# Patient Record
Sex: Female | Born: 1990 | Race: White | Hispanic: No | Smoking: Never smoker
Health system: Southern US, Community
[De-identification: ages and names within clinical notes are randomized; demographics above are authoritative.]

## PROBLEM LIST (undated history)

## (undated) DIAGNOSIS — B019 Varicella without complication: Secondary | ICD-10-CM

## (undated) DIAGNOSIS — J329 Chronic sinusitis, unspecified: Principal | ICD-10-CM

## (undated) DIAGNOSIS — F32A Depression, unspecified: Secondary | ICD-10-CM

## (undated) DIAGNOSIS — R635 Abnormal weight gain: Secondary | ICD-10-CM

## (undated) DIAGNOSIS — K589 Irritable bowel syndrome without diarrhea: Secondary | ICD-10-CM

## (undated) DIAGNOSIS — H00019 Hordeolum externum unspecified eye, unspecified eyelid: Secondary | ICD-10-CM

## (undated) DIAGNOSIS — T7840XA Allergy, unspecified, initial encounter: Secondary | ICD-10-CM

## (undated) DIAGNOSIS — F329 Major depressive disorder, single episode, unspecified: Secondary | ICD-10-CM

## (undated) DIAGNOSIS — Z0000 Encounter for general adult medical examination without abnormal findings: Secondary | ICD-10-CM

## (undated) DIAGNOSIS — F419 Anxiety disorder, unspecified: Secondary | ICD-10-CM

## (undated) DIAGNOSIS — J45909 Unspecified asthma, uncomplicated: Secondary | ICD-10-CM

## (undated) DIAGNOSIS — E282 Polycystic ovarian syndrome: Secondary | ICD-10-CM

## (undated) DIAGNOSIS — H548 Legal blindness, as defined in USA: Secondary | ICD-10-CM

## (undated) DIAGNOSIS — F988 Other specified behavioral and emotional disorders with onset usually occurring in childhood and adolescence: Secondary | ICD-10-CM

## (undated) HISTORY — DX: Major depressive disorder, single episode, unspecified: F32.9

## (undated) HISTORY — DX: Anxiety disorder, unspecified: F41.9

## (undated) HISTORY — PX: WISDOM TOOTH EXTRACTION: SHX21

## (undated) HISTORY — DX: Polycystic ovarian syndrome: E28.2

## (undated) HISTORY — DX: Allergy, unspecified, initial encounter: T78.40XA

## (undated) HISTORY — DX: Depression, unspecified: F32.A

## (undated) HISTORY — DX: Irritable bowel syndrome without diarrhea: K58.9

## (undated) HISTORY — DX: Hordeolum externum unspecified eye, unspecified eyelid: H00.019

## (undated) HISTORY — DX: Other specified behavioral and emotional disorders with onset usually occurring in childhood and adolescence: F98.8

## (undated) HISTORY — DX: Encounter for general adult medical examination without abnormal findings: Z00.00

## (undated) HISTORY — DX: Abnormal weight gain: R63.5

## (undated) HISTORY — DX: Varicella without complication: B01.9

## (undated) HISTORY — PX: TONSILLECTOMY: SHX5217

## (undated) HISTORY — DX: Unspecified asthma, uncomplicated: J45.909

## (undated) HISTORY — DX: Chronic sinusitis, unspecified: J32.9

## (undated) HISTORY — DX: Legal blindness, as defined in USA: H54.8

---
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
## 2011-12-17 ENCOUNTER — Encounter: Payer: Self-pay | Admitting: Family Medicine

## 2011-12-17 ENCOUNTER — Ambulatory Visit (INDEPENDENT_AMBULATORY_CARE_PROVIDER_SITE_OTHER): Payer: Managed Care, Other (non HMO) | Admitting: Family Medicine

## 2011-12-17 VITALS — BP 105/66 | HR 74 | Temp 97.5°F | Ht 64.75 in | Wt 130.1 lb

## 2011-12-17 DIAGNOSIS — J329 Chronic sinusitis, unspecified: Secondary | ICD-10-CM

## 2011-12-17 DIAGNOSIS — K589 Irritable bowel syndrome without diarrhea: Secondary | ICD-10-CM

## 2011-12-17 DIAGNOSIS — E282 Polycystic ovarian syndrome: Secondary | ICD-10-CM

## 2011-12-17 DIAGNOSIS — F988 Other specified behavioral and emotional disorders with onset usually occurring in childhood and adolescence: Secondary | ICD-10-CM

## 2011-12-17 DIAGNOSIS — H548 Legal blindness, as defined in USA: Secondary | ICD-10-CM

## 2011-12-17 DIAGNOSIS — F419 Anxiety disorder, unspecified: Secondary | ICD-10-CM

## 2011-12-17 DIAGNOSIS — Z0000 Encounter for general adult medical examination without abnormal findings: Secondary | ICD-10-CM

## 2011-12-17 DIAGNOSIS — F329 Major depressive disorder, single episode, unspecified: Secondary | ICD-10-CM | POA: Insufficient documentation

## 2011-12-17 DIAGNOSIS — F32A Depression, unspecified: Secondary | ICD-10-CM | POA: Insufficient documentation

## 2011-12-17 DIAGNOSIS — F341 Dysthymic disorder: Secondary | ICD-10-CM

## 2011-12-17 HISTORY — DX: Polycystic ovarian syndrome: E28.2

## 2011-12-17 HISTORY — DX: Chronic sinusitis, unspecified: J32.9

## 2011-12-17 HISTORY — DX: Encounter for general adult medical examination without abnormal findings: Z00.00

## 2011-12-17 MED ORDER — AZITHROMYCIN 250 MG PO TABS: ORAL_TABLET | ORAL | Status: AC

## 2011-12-17 MED ORDER — GUAIFENESIN ER 600 MG PO TB12: 600.0000 mg | ORAL_TABLET | Freq: Two times a day (BID) | ORAL | Status: DC

## 2011-12-17 NOTE — Progress Notes (Signed)
Patient ID: Susan Schroeder, female   DOB: 11-Dec-1990, 21 y.o.   MRN: [id number] Susan Schroeder [id number] 1990-09-07 12/17/2011      Progress Note-Follow Up  Subjective  Chief Complaint  Chief Complaint  Patient presents with  . Establish Care    new patient    HPI  Patient is a 21 year old Caucasian female who is here today to establish care. She is here today mostly because she has a sinus infection and has been told by work she needs a work note either keeping her out of work or allow her to return to work in her current state of health. She's been struggling with head congestion for about 4-5 days now. She is headache and congestion some clear rhinorrhea. She's had some nosebleeds and a cough which is dry most of time. She has some mild fatigue but no myalgias no fevers or chills. Otherwise she struggles with irritable bowel syndrome for which she uses Bentyl intermittently but has not had trouble recently. She's in school and working and lives with her parents at this time. 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 Follows with Dr. Clarisa Kindred. She denies any significant other recent illness. No chest pain, palpitations, shortness of breath, wheezing, GI or GU complaints although she does note roughly one episode of loose stool daily for the last couple of days  Past Medical History  Diagnosis Date  . Chicken pox as a child  . Anxiety   . Depression   . Diabetes mellitus     pre diabetes- when younger  . ADD (attention deficit disorder) 21 yrs old  . Asthma     as young child  . Sinusitis 12/17/2011  . Preventative health care 12/17/2011  . PCOS (polycystic ovarian syndrome) 12/17/2011  . Anxiety and depression     Past Surgical History  Procedure Date  . Wisdom tooth extraction 21 yrs old  . Tonsillectomy 21 yrs old    Family History  Problem Relation Age  of Onset  . Diabetes Paternal Grandmother   . Hypertension Paternal Grandmother   . Hyperlipidemia Paternal Grandmother     History   Social History  . Marital Status: Unknown    Spouse Name: N/A    Number of Children: N/A  . Years of Education: N/A   Occupational History  . Not on file.   Social History Main Topics  . Smoking status: Never Smoker   . Smokeless tobacco: Never Used  . Alcohol Use: No  . Drug Use: No  . Sexually Active: Yes -- Female partner(s)   Other Topics Concern  . Not on file   Social History Narrative  . No narrative on file    Current Outpatient Prescriptions on File Prior to Visit  Medication Sig Dispense Refill  . dicyclomine (BENTYL) 20 MG tablet Take 20 mg by mouth as needed.      . drospirenone-ethinyl estradiol (YAZ,GIANVI,LORYNA) 3-0.02 MG tablet Take 1 tablet by mouth daily.      Marland Kitchen escitalopram (LEXAPRO) 10 MG tablet Take 10 mg by mouth daily.      . methylphenidate (CONCERTA) 36 MG CR tablet Take 36 mg by mouth every morning.        No Known Allergies  Review of Systems  Review of Systems  Constitutional: Negative for fever and malaise/fatigue.  HENT: Positive for congestion and sore throat.  Eyes: Negative for discharge.  Respiratory: Positive for cough and sputum production. Negative for shortness of breath.   Cardiovascular: Negative for chest pain, palpitations and leg swelling.  Gastrointestinal: Negative for nausea, abdominal pain and diarrhea.  Genitourinary: Negative for dysuria.  Musculoskeletal: Negative for falls.  Skin: Negative for rash.  Neurological: Negative for loss of consciousness and headaches.  Endo/Heme/Allergies: Negative for polydipsia.  Psychiatric/Behavioral: Negative for depression and suicidal ideas. The patient is not nervous/anxious and does not have insomnia.     Objective  BP 105/66  Pulse 74  Temp 97.5 F (36.4 C) (Temporal)  Ht 5' 4.75" (1.645 m)  Wt 130 lb 1.9 oz (59.022 kg)  BMI 21.82  kg/m2  SpO2 98%  LMP 11/16/2011  Physical Exam  Physical Exam  Constitutional: She is oriented to person, place, and time and well-developed, well-nourished, and in no distress. No distress.  HENT:  Head: Normocephalic and atraumatic.       Oropharynx erythematous.  Eyes: Conjunctivae are normal.  Neck: Neck supple. No thyromegaly present.  Cardiovascular: Normal rate, regular rhythm and normal heart sounds.   No murmur heard. Pulmonary/Chest: Effort normal and breath sounds normal. She has no wheezes.  Abdominal: She exhibits no distension and no mass.  Musculoskeletal: She exhibits no edema.  Lymphadenopathy:    She has no cervical adenopathy.  Neurological: She is alert and oriented to person, place, and time.  Skin: Skin is warm and dry. No rash noted. She is not diaphoretic.  Psychiatric: Memory, affect and judgment normal.      Assessment & Plan  Sinusitis Zpak, mucinex, increase rest and fluids.  PCOS (polycystic ovarian syndrome) Follows with endocrinology and is on OCPs.  ADD (attention deficit disorder) Is on Concerta and she finds it helpful while in school  Preventative health care She agrees to get [place] copies of her labs she has done with her endocrinologist. Request old records. Return in 6 months or as needed  Anxiety and depression Follows with psychiatry and they prescribe her anti depressants. Doing well on her current meds.  IBS (irritable bowel syndrome) Stable at the moment, uses Bentyl infrequently with good results, encouraged daily fiber and probiotics.

## 2011-12-17 NOTE — Assessment & Plan Note (Signed)
Follows with endocrinology and is on OCPs.

## 2011-12-17 NOTE — Assessment & Plan Note (Signed)
Is on Concerta and she finds it helpful while in school

## 2011-12-17 NOTE — Patient Instructions (Addendum)

## 2011-12-17 NOTE — Assessment & Plan Note (Signed)
She agrees to get [place] copies of her labs she has done with her endocrinologist. Request old records. Return in 6 months or as needed

## 2011-12-17 NOTE — Assessment & Plan Note (Signed)
Follows with psychiatry and they prescribe her anti depressants. Doing well on her current meds.

## 2011-12-17 NOTE — Assessment & Plan Note (Signed)
Zpak, mucinex, increase rest and fluids.

## 2011-12-18 ENCOUNTER — Encounter: Payer: Self-pay | Admitting: Family Medicine

## 2011-12-18 DIAGNOSIS — H548 Legal blindness, as defined in USA: Secondary | ICD-10-CM

## 2011-12-18 DIAGNOSIS — K589 Irritable bowel syndrome without diarrhea: Secondary | ICD-10-CM

## 2011-12-18 HISTORY — DX: Legal blindness, as defined in USA: H54.8

## 2011-12-18 HISTORY — DX: Irritable bowel syndrome, unspecified: K58.9

## 2011-12-18 NOTE — Assessment & Plan Note (Signed)
Stable at the moment, uses Bentyl infrequently with good results, encouraged daily fiber and probiotics.

## 2012-06-10 ENCOUNTER — Ambulatory Visit: Payer: Managed Care, Other (non HMO) | Admitting: Family Medicine

## 2012-10-18 ENCOUNTER — Ambulatory Visit: Payer: Managed Care, Other (non HMO) | Admitting: Family

## 2012-10-22 ENCOUNTER — Ambulatory Visit (INDEPENDENT_AMBULATORY_CARE_PROVIDER_SITE_OTHER): Payer: Managed Care, Other (non HMO) | Admitting: Family Medicine

## 2012-10-22 ENCOUNTER — Encounter: Payer: Self-pay | Admitting: Family Medicine

## 2012-10-22 VITALS — BP 118/64 | HR 66 | Temp 98.2°F | Ht 64.75 in | Wt 146.0 lb

## 2012-10-22 DIAGNOSIS — F419 Anxiety disorder, unspecified: Secondary | ICD-10-CM

## 2012-10-22 DIAGNOSIS — R635 Abnormal weight gain: Secondary | ICD-10-CM

## 2012-10-22 DIAGNOSIS — F341 Dysthymic disorder: Secondary | ICD-10-CM

## 2012-10-22 DIAGNOSIS — J45909 Unspecified asthma, uncomplicated: Secondary | ICD-10-CM

## 2012-10-22 DIAGNOSIS — H44001 Unspecified purulent endophthalmitis, right eye: Secondary | ICD-10-CM

## 2012-10-22 DIAGNOSIS — H44009 Unspecified purulent endophthalmitis, unspecified eye: Secondary | ICD-10-CM

## 2012-10-22 DIAGNOSIS — H00019 Hordeolum externum unspecified eye, unspecified eyelid: Secondary | ICD-10-CM

## 2012-10-22 DIAGNOSIS — T7840XA Allergy, unspecified, initial encounter: Secondary | ICD-10-CM

## 2012-10-22 DIAGNOSIS — H00013 Hordeolum externum right eye, unspecified eyelid: Secondary | ICD-10-CM

## 2012-10-22 LAB — RENAL FUNCTION PANEL
Albumin: 4.2 g/dL (ref 3.5–5.2)
BUN: 14 mg/dL (ref 6–23)
CO2: 27 mEq/L (ref 19–32)
Calcium: 9.8 mg/dL (ref 8.4–10.5)
Chloride: 105 mEq/L (ref 96–112)
Creat: 0.8 mg/dL (ref 0.50–1.10)
Glucose, Bld: 84 mg/dL (ref 70–99)
Phosphorus: 4.3 mg/dL (ref 2.3–4.6)
Potassium: 4.5 mEq/L (ref 3.5–5.3)
Sodium: 139 mEq/L (ref 135–145)

## 2012-10-22 LAB — CBC
MCHC: 33.7 g/dL (ref 30.0–36.0)
RBC: 4.78 MIL/uL (ref 3.87–5.11)
WBC: 6 10*3/uL (ref 4.0–10.5)

## 2012-10-22 MED ORDER — LORATADINE 10 MG PO TABS: 10.0000 mg | ORAL_TABLET | Freq: Every day | ORAL | Status: AC

## 2012-10-22 MED ORDER — ALBUTEROL SULFATE HFA 108 (90 BASE) MCG/ACT IN AERS: 2.0000 | INHALATION_SPRAY | Freq: Four times a day (QID) | RESPIRATORY_TRACT | Status: AC | PRN

## 2012-10-22 NOTE — Patient Instructions (Addendum)
Probiotics such as Digestive Advantage daily Witch Hazel Astringent  Annual exam 6- 12 months or as needed   DASH Diet The DASH diet stands for "Dietary Approaches to Stop Hypertension." It is a healthy eating plan that has been shown to reduce high blood pressure (hypertension) in as little as 14 days, while also possibly providing other significant health benefits. These other health benefits include reducing the risk of breast cancer after menopause and reducing the risk of type 2 diabetes, heart disease, colon cancer, and stroke. Health benefits also include weight loss and slowing kidney failure in patients with chronic kidney disease.  DIET GUIDELINES  Limit salt (sodium). Your diet should contain less than 1500 mg of sodium daily.  Limit refined or processed carbohydrates. Your diet should include mostly whole grains. Desserts and added sugars should be used sparingly.  Include small amounts of heart-healthy fats. These types of fats include nuts, oils, and tub margarine. Limit saturated and trans fats. These fats have been shown to be harmful in the body. CHOOSING FOODS  The following food groups are based on a 2000 calorie diet. See your Registered Dietitian for individual calorie needs. Grains and Grain Products (6 to 8 servings daily)  Eat More Often: Whole-wheat bread, brown rice, whole-grain or wheat pasta, quinoa, popcorn without added fat or salt (air popped).  Eat Less Often: White bread, white pasta, white rice, cornbread. Vegetables (4 to 5 servings daily)  Eat More Often: Fresh, frozen, and canned vegetables. Vegetables may be raw, steamed, roasted, or grilled with a minimal amount of fat.  Eat Less Often/Avoid: Creamed or fried vegetables. Vegetables in a cheese sauce. Fruit (4 to 5 servings daily)  Eat More Often: All fresh, canned (in natural juice), or frozen fruits. Dried fruits without added sugar. One hundred percent fruit juice ( cup [237 mL] daily).  Eat  Less Often: Dried fruits with added sugar. Canned fruit in light or heavy syrup. [employer], Fish, and Poultry (2 servings or less daily. One serving is 3 to 4 oz [85-114 g]).  Eat More Often: Ninety percent or leaner ground beef, tenderloin, sirloin. Round cuts of beef, chicken breast, [place] breast. All fish. Grill, bake, or broil your meat. Nothing should be fried.  Eat Less Often/Avoid: Fatty cuts of meat, [place], or chicken leg, thigh, or wing. Fried cuts of meat or fish. Dairy (2 to 3 servings)  Eat More Often: Low-fat or fat-free milk, low-fat plain or light yogurt, reduced-fat or part-skim cheese.  Eat Less Often/Avoid: Milk (whole, 2%).Whole milk yogurt. Full-fat cheeses. Nuts, Seeds, and Legumes (4 to 5 servings per week)  Eat More Often: All without added salt.  Eat Less Often/Avoid: Salted nuts and seeds, canned beans with added salt. Fats and Sweets (limited)  Eat More Often: Vegetable oils, tub margarines without trans fats, sugar-free gelatin. Mayonnaise and salad dressings.  Eat Less Often/Avoid: Coconut oils, palm oils, butter, stick margarine, cream, half and half, cookies, candy, pie. FOR MORE INFORMATION The Dash Diet Eating Plan: www.dashdiet.org Document Released: 05/29/2011 Document Revised: 09/01/2011 Document Reviewed: 05/29/2011 [facility] Patient Information 2013 [place], [place].

## 2012-10-24 ENCOUNTER — Encounter: Payer: Self-pay | Admitting: Family Medicine

## 2012-10-24 DIAGNOSIS — T7840XA Allergy, unspecified, initial encounter: Secondary | ICD-10-CM

## 2012-10-24 DIAGNOSIS — R635 Abnormal weight gain: Secondary | ICD-10-CM | POA: Insufficient documentation

## 2012-10-24 DIAGNOSIS — H00019 Hordeolum externum unspecified eye, unspecified eyelid: Secondary | ICD-10-CM | POA: Insufficient documentation

## 2012-10-24 HISTORY — DX: Abnormal weight gain: R63.5

## 2012-10-24 HISTORY — DX: Hordeolum externum unspecified eye, unspecified eyelid: H00.019

## 2012-10-24 HISTORY — DX: Allergy, unspecified, initial encounter: T78.40XA

## 2012-10-24 NOTE — Assessment & Plan Note (Addendum)
Doing well at present time 

## 2012-10-24 NOTE — Assessment & Plan Note (Signed)
Resolved before visit with use of homeopathic eye drops, encouraged probiotics and report return of symptoms warm compresses prn

## 2012-10-24 NOTE — Assessment & Plan Note (Signed)
Claritin daily, nasal saline prn and given Albuterol for a h/o allergy induced asthma and some recent tightness

## 2012-10-24 NOTE — Assessment & Plan Note (Signed)
Encouraged DASH diet and increased exercise, referred to nutritionist and labs reviewed today

## 2012-10-24 NOTE — Progress Notes (Signed)
Patient ID: Susan Schroeder, female   DOB: 1990/08/31, 22 y.o.   MRN: [id number] Susan Schroeder [id number] 07/01/1990 10/24/2012      Progress Note-Follow Up  Subjective  Chief Complaint  Chief Complaint  Patient presents with  . Stye    gone now- pt would like md to still look at it- right eye    HPI  Patient is a 22 year old Caucasian female who is in today with numerous complaints. One is she had about a week's worth of this I actually 2 small ones in the right. Married results at today's visit after she started some homeopathic drops. They were not painful but they were irritated. No fevers or chills no significant discharge or pain. She has had some recent flare in allergies. Some nasal congestion and even some tightness in her chest suggestive of some asthma which she has had in the past. She's frustrated with recently and is requesting a referral to nutritionist with no marked health. No other acute complaints. She does exercise regularly and tries to maintain a heart healthy diet. No chest pain, palpitations, shortness of breath, GI or GU concerns noted.  Past Medical History  Diagnosis Date  . Chicken pox as a child  . Anxiety   . Depression   . Diabetes mellitus     pre diabetes- when younger  . ADD (attention deficit disorder) 22 yrs old  . Asthma     as young child  . Sinusitis 12/17/2011  . Preventative health care 12/17/2011  . PCOS (polycystic ovarian syndrome) 12/17/2011  . Anxiety and depression   . IBS (irritable bowel syndrome) 12/18/2011  . 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 Weight gain 10/24/2012  . Stye 10/24/2012    Right eye  . Allergic state 10/24/2012    Past Surgical History  Procedure Laterality Date  . Wisdom tooth extraction  22 yrs old  . Tonsillectomy  22 yrs old    Family History  Problem Relation Age of Onset  . Diabetes  Paternal Grandmother   . Hypertension Paternal Grandmother   . Hyperlipidemia Paternal Grandmother     History   Social History  . Marital Status: Unknown    Spouse Name: N/A    Number of Children: N/A  . Years of Education: N/A   Occupational History  . Not on file.   Social History Main Topics  . Smoking status: Never Smoker   . Smokeless tobacco: Never Used  . Alcohol Use: No  . Drug Use: No  . Sexually Active: Yes -- Female partner(s)   Other Topics Concern  . Not on file   Social History Narrative  . No narrative on file    Current Outpatient Prescriptions on File Prior to Visit  Medication Sig Dispense Refill  . dicyclomine (BENTYL) 20 MG tablet Take 20 mg by mouth as needed.      . drospirenone-ethinyl estradiol (YAZ,GIANVI,LORYNA) 3-0.02 MG tablet Take 1 tablet by mouth daily.      Marland Kitchen escitalopram (LEXAPRO) 10 MG tablet Take 10 mg by mouth daily.       No current facility-administered medications on file prior to visit.    No Known Allergies  Review of Systems  Review of Systems  Constitutional: Negative for fever and malaise/fatigue.  HENT: Positive for congestion.   Eyes: Negative for discharge.  Respiratory: Negative for shortness of breath.  Cardiovascular: Negative for chest pain, palpitations and leg swelling.  Gastrointestinal: Negative for nausea, abdominal pain and diarrhea.  Genitourinary: Negative for dysuria.  Musculoskeletal: Negative for falls.  Skin: Negative for rash.  Neurological: Negative for loss of consciousness and headaches.  Endo/Heme/Allergies: Negative for polydipsia.  Psychiatric/Behavioral: Negative for depression and suicidal ideas. The patient is not nervous/anxious and does not have insomnia.     Objective  BP 118/64  Pulse 66  Temp(Src) 98.2 F (36.8 C) (Oral)  Ht 5' 4.75" (1.645 m)  Wt 146 lb 0.6 oz (66.243 kg)  BMI 24.48 kg/m2  SpO2 97%  LMP 10/21/2012  Physical Exam  Physical Exam  Constitutional: She is  oriented to person, place, and time and well-developed, well-nourished, and in no distress. No distress.  HENT:  Head: Normocephalic and atraumatic.  Eyes: Conjunctivae are normal.  Neck: Neck supple. No thyromegaly present.  Cardiovascular: Normal rate, regular rhythm and normal heart sounds.   No murmur heard. Pulmonary/Chest: Effort normal and breath sounds normal. She has no wheezes.  Abdominal: She exhibits no distension and no mass.  Musculoskeletal: She exhibits no edema.  Lymphadenopathy:    She has no cervical adenopathy.  Neurological: She is alert and oriented to person, place, and time.  Skin: Skin is warm and dry. No rash noted. She is not diaphoretic.  Psychiatric: Memory, affect and judgment normal.    Lab Results  Component Value Date   TSH 2.959 10/22/2012   Lab Results  Component Value Date   WBC 6.0 10/22/2012   HGB 14.1 10/22/2012   HCT 41.9 10/22/2012   MCV 87.7 10/22/2012   PLT 287 10/22/2012   Lab Results  Component Value Date   CREATININE 0.80 10/22/2012   BUN 14 10/22/2012   NA 139 10/22/2012   K 4.5 10/22/2012   CL 105 10/22/2012   CO2 27 10/22/2012     Assessment & Plan  Anxiety and depression Doing well at present time  Weight gain Encouraged DASH diet and increased exercise, referred to nutritionist and labs reviewed today  Stye Resolved before visit with use of homeopathic eye drops, encouraged probiotics and report return of symptoms warm compresses prn  Allergic state Claritin daily, nasal saline prn and given Albuterol for a h/o allergy induced asthma and some recent tightness

## 2012-10-25 NOTE — Progress Notes (Signed)
Quick Note:  Patient Informed and voiced understanding ______ 

## 2012-11-23 ENCOUNTER — Encounter: Payer: Self-pay | Admitting: Family

## 2012-11-23 ENCOUNTER — Ambulatory Visit (INDEPENDENT_AMBULATORY_CARE_PROVIDER_SITE_OTHER): Payer: Managed Care, Other (non HMO) | Admitting: Family

## 2012-11-23 ENCOUNTER — Ambulatory Visit (HOSPITAL_BASED_OUTPATIENT_CLINIC_OR_DEPARTMENT_OTHER)
Admission: RE | Admit: 2012-11-23 | Discharge: 2012-11-23 | Disposition: A | Discharge status: Home / Self Care | Payer: Managed Care, Other (non HMO) | Source: Ambulatory Visit | Attending: Family | Admitting: Family

## 2012-11-23 VITALS — BP 114/70 | HR 77 | Temp 98.1°F | Resp 16 | Wt 142.0 lb

## 2012-11-23 DIAGNOSIS — R197 Diarrhea, unspecified: Secondary | ICD-10-CM | POA: Insufficient documentation

## 2012-11-23 DIAGNOSIS — R112 Nausea with vomiting, unspecified: Secondary | ICD-10-CM | POA: Insufficient documentation

## 2012-11-23 DIAGNOSIS — Z0189 Encounter for other specified special examinations: Secondary | ICD-10-CM

## 2012-11-23 MED ORDER — ONDANSETRON HCL 4 MG PO TABS: 4.0000 mg | ORAL_TABLET | Freq: Three times a day (TID) | ORAL | Status: AC | PRN

## 2012-11-23 NOTE — Patient Instructions (Addendum)
Please complete abdominal x ray on the first floor. Start zofran.  If still unable to keep down small frequent sips or if abdominal pain occurs, please go to the ER.   Call if symptoms are not completely resolved in 1 week.

## 2012-11-23 NOTE — Progress Notes (Signed)
Subjective:    Patient ID: Susan Schroeder, female    DOB: Oct 27, 1990, 21 y.o.   MRN: [id number]  HPI   Susan Schroeder is a 22 yr old female who presents today with chief complaint of vomiting.  Symptoms started on 6/1.  Recently moved.  Reports that she is concerned about possible food poisoning.  She denies associated abdominal pain, anorexia or fever.  She does report associated episode of diarrhea yesterday AM. Reports that she is only keeping food down briefly.  Even water comes back up.       Review of Systems See HPI  Past Medical History  Diagnosis Date  . Chicken pox as a child  . Anxiety   . Depression   . Diabetes mellitus     pre diabetes- when younger  . ADD (attention deficit disorder) 22 yrs old  . Asthma     as young child  . Sinusitis 12/17/2011  . Preventative health care 12/17/2011  . PCOS (polycystic ovarian syndrome) 12/17/2011  . Anxiety and depression   . IBS (irritable bowel syndrome) 12/18/2011  . Blindness, legal 12/18/2011    [facility] follows with opthamology, Susan Schroeder Had a retinal tear in left eye earlier this year requiring 2 surgeries   . Weight gain 10/24/2012  . Stye 10/24/2012    Right eye  . Allergic state 10/24/2012    History   Social History  . Marital Status: Unknown    Spouse Name: N/A    Number of Children: N/A  . Years of Education: N/A   Occupational History  . Not on file.   Social History Main Topics  . Smoking status: Never Smoker   . Smokeless tobacco: Never Used  . Alcohol Use: No  . Drug Use: No  . Sexually Active: Yes -- Female partner(s)   Other Topics Concern  . Not on file   Social History Narrative  . No narrative on file    Past Surgical History  Procedure Laterality Date  . Wisdom tooth extraction  22 yrs old  . Tonsillectomy  22 yrs old    Family History  Problem Relation Age of Onset  . Diabetes Paternal Grandmother   . Hypertension Paternal Grandmother   . Hyperlipidemia Paternal Grandmother      No Known Allergies  Current Outpatient Prescriptions on File Prior to Visit  Medication Sig Dispense Refill  . albuterol (PROVENTIL HFA;VENTOLIN HFA) 108 (90 BASE) MCG/ACT inhaler Inhale 2 puffs into the lungs every 6 (six) hours as needed for wheezing.  1 Inhaler  2  . cholecalciferol (VITAMIN D) 1000 UNITS tablet Take 2,000 Units by mouth daily.      Marland Kitchen dicyclomine (BENTYL) 20 MG tablet Take 20 mg by mouth as needed.      . drospirenone-ethinyl estradiol (YAZ,GIANVI,LORYNA) 3-0.02 MG tablet Take 1 tablet by mouth daily.      Marland Kitchen escitalopram (LEXAPRO) 10 MG tablet Take 10 mg by mouth daily.      Marland Kitchen loratadine (CLARITIN) 10 MG tablet Take 1 tablet (10 mg total) by mouth daily.  30 tablet  11  . Multiple Vitamin (MULTIVITAMIN) tablet Take 1 tablet by mouth daily.       No current facility-administered medications on file prior to visit.    BP 114/70  Pulse 77  Temp(Src) 98.1 F (36.7 C) (Oral)  Resp 16  Wt 142 lb (64.411 kg)  BMI 23.8 kg/m2  SpO2 99%  LMP 11/09/2012       Objective:  Physical Exam  Constitutional: She is oriented to person, place, and time. She appears well-developed and well-nourished. No distress.  HENT:  Head: Normocephalic and atraumatic.  Cardiovascular: Normal rate and regular rhythm.   No murmur heard. Pulmonary/Chest: Effort normal and breath sounds normal. No respiratory distress. She has no wheezes. She has no rales. She exhibits no tenderness.  Abdominal: Soft. She exhibits no distension and no mass. There is no rebound and no guarding.  Mild periumbilical abdominal discomfort on exam ("pressure" per pt) No RLQ tenderness  Neurological: She is alert and oriented to person, place, and time.  Psychiatric: She has a normal mood and affect. Her behavior is normal. Judgment and thought content normal.          Assessment & Plan:

## 2012-11-23 NOTE — Assessment & Plan Note (Addendum)
Likely acute gastroenteritis, doubt food poisoning at this point as symptoms are ongoing.  KUB negative for SBO or free air.  Non-acute abdomen.  Rx with zofran prn.   Pt instructed to go to the ER if worsening abdominal pain, or if unable to keep down food/liquid despite anti-emetic.

## 2013-04-26 ENCOUNTER — Ambulatory Visit (INDEPENDENT_AMBULATORY_CARE_PROVIDER_SITE_OTHER): Payer: Managed Care, Other (non HMO) | Admitting: Family

## 2013-04-26 ENCOUNTER — Encounter: Payer: Self-pay | Admitting: Family

## 2013-04-26 VITALS — BP 110/80 | HR 71 | Temp 98.5°F | Resp 16 | Ht 64.75 in | Wt 153.1 lb

## 2013-04-26 DIAGNOSIS — J329 Chronic sinusitis, unspecified: Secondary | ICD-10-CM

## 2013-04-26 MED ORDER — FLUTICASONE PROPIONATE 50 MCG/ACT NA SUSP: 2.0000 | Freq: Every day | NASAL | Status: AC

## 2013-04-26 MED ORDER — AMOXICILLIN-POT CLAVULANATE 875-125 MG PO TABS: 1.0000 | ORAL_TABLET | Freq: Two times a day (BID) | ORAL | Status: DC

## 2013-04-26 NOTE — Progress Notes (Signed)
Subjective:    Patient ID: Susan Schroeder, female    DOB: 05/17/1991, 22 y.o.   MRN: [id number]  HPI  Susan Schroeder is a 22 yr old female with chief complaint of nasal congestion x 6 days. She reports that she has tried nyquil/dayquil and mucinex.  No significant improvement with these medications.  Hot shower helps the most.  She denies associated fever or cough. Reports energy level is low.  She reports + frontal and maxillary sinus pressure.  Has trouble laying flat due to congestion.  Nasal drainage is bright green.    Review of Systems See HPI  Past Medical History  Diagnosis Date  . Chicken pox as a child  . Anxiety   . Depression   . Diabetes mellitus     pre diabetes- when younger  . ADD (attention deficit disorder) 22 yrs old  . Asthma     as young child  . Sinusitis 12/17/2011  . Preventative health care 12/17/2011  . PCOS (polycystic ovarian syndrome) 12/17/2011  . Anxiety and depression   . IBS (irritable bowel syndrome) 12/18/2011  . 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 Weight gain 10/24/2012  . Stye 10/24/2012    Right eye  . Allergic state 10/24/2012    History   Social History  . Marital Status: Unknown    Spouse Name: N/A    Number of Children: N/A  . Years of Education: N/A   Occupational History  . Not on file.   Social History Main Topics  . Smoking status: Never Smoker   . Smokeless tobacco: Never Used  . Alcohol Use: No  . Drug Use: No  . Sexual Activity: Yes    Partners: Male   Other Topics Concern  . Not on file   Social History Narrative  . No narrative on file    Past Surgical History  Procedure Laterality Date  . Wisdom tooth extraction  22 yrs old  . Tonsillectomy  22 yrs old    Family History  Problem Relation Age of Onset  . Diabetes Paternal Grandmother   . Hypertension Paternal Grandmother   .  Hyperlipidemia Paternal Grandmother     No Known Allergies  Current Outpatient Prescriptions on File Prior to Visit  Medication Sig Dispense Refill  . albuterol (PROVENTIL HFA;VENTOLIN HFA) 108 (90 BASE) MCG/ACT inhaler Inhale 2 puffs into the lungs every 6 (six) hours as needed for wheezing.  1 Inhaler  2  . cholecalciferol (VITAMIN D) 1000 UNITS tablet Take 2,000 Units by mouth daily.      Marland Kitchen dicyclomine (BENTYL) 20 MG tablet Take 20 mg by mouth as needed.      . drospirenone-ethinyl estradiol (YAZ,GIANVI,LORYNA) 3-0.02 MG tablet Take 1 tablet by mouth daily.      Marland Kitchen escitalopram (LEXAPRO) 10 MG tablet Take 10 mg by mouth daily.      Marland Kitchen loratadine (CLARITIN) 10 MG tablet Take 1 tablet (10 mg total) by mouth daily.  30 tablet  11  . Multiple Vitamin (MULTIVITAMIN) tablet Take 1 tablet by mouth daily.      . ondansetron (ZOFRAN) 4 MG tablet Take 1 tablet (4 mg total) by mouth every 8 (eight) hours as needed for nausea.  20 tablet  0   No current facility-administered medications on file prior to visit.    BP 110/80  Pulse 71  Temp(Src)  98.5 F (36.9 C) (Oral)  Resp 16  Ht 5' 4.75" (1.645 m)  Wt 153 lb 1.9 oz (69.455 kg)  BMI 25.67 kg/m2  SpO2 99%       Objective:   Physical Exam  Constitutional: She appears well-developed and well-nourished. No distress.  HENT:  Head: Normocephalic and atraumatic.  Right Ear: Tympanic membrane and ear canal normal.  Left Ear: Tympanic membrane and ear canal normal.  Mouth/Throat: No oropharyngeal exudate, posterior oropharyngeal edema or posterior oropharyngeal erythema.  Cardiovascular: Normal rate and regular rhythm.   No murmur heard. Pulmonary/Chest: Effort normal and breath sounds normal. No respiratory distress. She has no wheezes. She has no rales. She exhibits no tenderness.          Assessment & Plan:

## 2013-04-26 NOTE — Patient Instructions (Signed)

## 2013-04-28 NOTE — Assessment & Plan Note (Addendum)
Will rx with augmentin. Pt instructed to follow up if symptoms worsen or if symptoms do not improve.

## 2013-05-30 ENCOUNTER — Encounter: Payer: Self-pay | Admitting: Physician Assistant

## 2013-05-30 ENCOUNTER — Ambulatory Visit (INDEPENDENT_AMBULATORY_CARE_PROVIDER_SITE_OTHER): Payer: Managed Care, Other (non HMO) | Admitting: Physician Assistant

## 2013-05-30 VITALS — BP 98/60 | HR 61 | Temp 98.7°F | Ht 64.75 in | Wt 158.0 lb

## 2013-05-30 DIAGNOSIS — F341 Dysthymic disorder: Secondary | ICD-10-CM

## 2013-05-30 DIAGNOSIS — Z23 Encounter for immunization: Secondary | ICD-10-CM

## 2013-05-30 DIAGNOSIS — F329 Major depressive disorder, single episode, unspecified: Secondary | ICD-10-CM

## 2013-05-30 MED ORDER — ESCITALOPRAM OXALATE 10 MG PO TABS: 10.0000 mg | ORAL_TABLET | Freq: Every day | ORAL | Status: DC

## 2013-05-30 NOTE — Patient Instructions (Signed)
Depression, Adult Depression refers to feeling sad, low, down in the dumps, blue, gloomy, or empty. In general, there are two kinds of depression: 1. Depression that we all experience from time to time because of upsetting life experiences, including the loss of a job or the ending of a relationship (normal sadness or normal grief). This kind of depression is considered normal, is short lived, and resolves within a few days to 2 weeks. (Depression experienced after the loss of a loved one is called bereavement. Bereavement often lasts longer than 2 weeks but normally gets better with time.) 2. Clinical depression, which lasts longer than normal sadness or normal grief or interferes with your ability to function at home, at work, and in school. It also interferes with your personal relationships. It affects almost every aspect of your life. Clinical depression is an illness. Symptoms of depression also can be caused by conditions other than normal sadness and grief or clinical depression. Examples of these conditions are listed as follows:  Physical illness Some physical illnesses, including underactive thyroid gland (hypothyroidism), severe anemia, specific types of cancer, diabetes, uncontrolled seizures, heart and lung problems, strokes, and chronic pain are commonly associated with symptoms of depression.  Side effects of some prescription medicine In some people, certain types of prescription medicine can cause symptoms of depression.  Substance abuse Abuse of alcohol and illicit drugs can cause symptoms of depression. SYMPTOMS Symptoms of normal sadness and normal grief include the following:  Feeling sad or crying for short periods of time.  Not caring about anything (apathy).  Difficulty sleeping or sleeping too much.  No longer able to enjoy the things you used to enjoy.  Desire to be by oneself all the time (social isolation).  Lack of energy or motivation.  Difficulty  concentrating or remembering.  Change in appetite or weight.  Restlessness or agitation. Symptoms of clinical depression include the same symptoms of normal sadness or normal grief and also the following symptoms:  Feeling sad or crying all the time.  Feelings of guilt or worthlessness.  Feelings of hopelessness or helplessness.  Thoughts of suicide or the desire to harm yourself (suicidal ideation).  Loss of touch with reality (psychotic symptoms). Seeing or hearing things that are not real (hallucinations) or having false beliefs about your life or the people around you (delusions and paranoia). DIAGNOSIS  The diagnosis of clinical depression usually is based on the severity and duration of the symptoms. Your caregiver also will ask you questions about your medical history and substance use to find out if physical illness, use of prescription medicine, or substance abuse is causing your depression. Your caregiver also may order blood tests. TREATMENT  Typically, normal sadness and normal grief do not require treatment. However, sometimes antidepressant medicine is prescribed for bereavement to ease the depressive symptoms until they resolve. The treatment for clinical depression depends on the severity of your symptoms but typically includes antidepressant medicine, counseling with a mental health professional, or a combination of both. Your caregiver will help to determine what treatment is best for you. Depression caused by physical illness usually goes away with appropriate medical treatment of the illness. If prescription medicine is causing depression, talk with your caregiver about stopping the medicine, decreasing the dose, or substituting another medicine. Depression caused by abuse of alcohol or illicit drugs abuse goes away with abstinence from these substances. Some adults need professional help in order to stop drinking or using drugs. SEEK IMMEDIATE CARE IF:  You have   thoughts  about hurting yourself or others.  You lose touch with reality (have psychotic symptoms).  You are taking medicine for depression and have a serious side effect. FOR MORE INFORMATION National Alliance on Mental Illness: www.nami.[employer]: http://www.maynard.net/ Document Released: 06/06/2000 Document Revised: 12/09/2011 Document Reviewed: 09/08/2011 [facility] Patient Information 2014 [place], [place].  Escitalopram tablets What is this medicine? ESCITALOPRAM (es sye TAL oh pram) is used to treat depression and certain types of anxiety. This medicine may be used for other purposes; ask your health care provider or pharmacist if you have questions. COMMON BRAND NAME(S): Lexapro What should I tell my health care provider before I take this medicine? They need to know if you have any of these conditions: -bipolar disorder or a family history of bipolar disorder -diabetes -glaucoma -heart disease -kidney or liver disease -receiving electroconvulsive therapy -seizures (convulsions) -suicidal thoughts, plans, or attempt by you or a family member -an unusual or allergic reaction to escitalopram, the related drug citalopram, other medicines, foods, dyes, or preservatives -pregnant or trying to become pregnant -breast-feeding How should I use this medicine? Take this medicine by mouth with a glass of water. Follow the directions on the prescription label. You can take it with or without food. If it upsets your stomach, take it with food. Take your medicine at regular intervals. Do not take it more often than directed. Do not stop taking this medicine suddenly except upon the advice of your doctor. Stopping this medicine too quickly may cause serious side effects or your condition may worsen. A special MedGuide will be given to you by the pharmacist with each prescription and refill. Be sure to read this information carefully each time. Talk to your pediatrician regarding  the use of this medicine in children. Special care may be needed. Overdosage: If you think you have taken too much of this medicine contact a poison control center or emergency room at once. NOTE: This medicine is only for you. Do not share this medicine with others. What if I miss a dose? If you miss a dose, take it as soon as you can. If it is almost time for your next dose, take only that dose. Do not take double or extra doses. What may interact with this medicine? Do not take this medicine with any of the following medications: -cisapride -citalopram -linezolid -MAOIs like Carbex, Eldepryl, Marplan, Nardil, and Parnate -methylene blue (injected into a vein) -pimozide This medicine may also interact with the following medications: -alcohol -aspirin and aspirin-like medicines -carbamazepine -certain medicines for depression, anxiety, or psychotic disturbances -certain medicines for migraine headache like almotriptan, eletriptan, frovatriptan, naratriptan, rizatriptan, sumatriptan, zolmitriptan -cimetidine -diuretics -fentanyl -furazolidone -isoniazid -ketoconazole -lithium -medicines that treat or prevent blood clots like warfarin, enoxaparin, and dalteparin -medicines for sleep -metoprolol -NSAIDs, medicines for pain and inflammation, like ibuprofen or naproxen -procarbazine -rasagiline -supplements like St. John's wort, kava kava, valerian -tramadol -tryptophan This list may not describe all possible interactions. Give your health care provider a list of all the medicines, herbs, non-prescription drugs, or dietary supplements you use. Also tell them if you smoke, drink alcohol, or use illegal drugs. Some items may interact with your medicine. What should I watch for while using this medicine? Tell your doctor if your symptoms do not get better or if they get worse. Visit your doctor or health care professional for regular checks on your progress. Because it may take several  weeks to see the full effects of this medicine,  it is important to continue your treatment as prescribed by your doctor. Patients and their families should watch out for new or worsening thoughts of suicide or depression. Also watch out for sudden changes in feelings such as feeling anxious, agitated, panicky, irritable, hostile, aggressive, impulsive, severely restless, overly excited and hyperactive, or not being able to sleep. If this happens, especially at the beginning of treatment or after a change in dose, call your health care professional. Bonita Quin may get drowsy or dizzy. Do not drive, use machinery, or do anything that needs mental alertness until you know how this medicine affects you. Do not stand or sit up quickly, especially if you are an older patient. This reduces the risk of dizzy or fainting spells. Alcohol may interfere with the effect of this medicine. Avoid alcoholic drinks. Your mouth may get dry. Chewing sugarless gum or sucking hard candy, and drinking plenty of water may help. Contact your doctor if the problem does not go away or is severe. What side effects may I notice from receiving this medicine? Side effects that you should report to your doctor or health care professional as soon as possible: -allergic reactions like skin rash, itching or hives, swelling of the face, lips, or tongue -confusion -feeling faint or lightheaded, falls -fast talking and excited feelings or actions that are out of control -hallucination, loss of contact with reality -seizures -suicidal thoughts or other mood changes -unusual bleeding or bruising Side effects that usually do not require medical attention (report to your doctor or health care professional if they continue or are bothersome): -blurred vision -changes in appetite -change in sex drive or performance -headache -increased sweating -nausea This list may not describe all possible side effects. Call your doctor for medical advice  about side effects. You may report side effects to FDA at 1-800-FDA-1088. Where should I keep my medicine? Keep out of reach of children. Store at room temperature between 15 and 30 degrees C (59 and 86 degrees F). Throw away any unused medicine after the expiration date. NOTE: This sheet is a summary. It may not cover all possible information. If you have questions about this medicine, talk to your doctor, pharmacist, or health care provider.  2014, [employer]vier/Gold Standard. (2012-12-31 12:53:42)

## 2013-05-30 NOTE — Progress Notes (Signed)
Patient ID: Susan Schroeder, female   DOB: 1990-12-21, 21 y.o.   MRN: [id number]  Patient presents to clinic today with for medication refill.  Patient currently on Lexapro 10 mg daily for depression and anxiety. Patient had been followed by psychiatry for this issue. Patient states that she is no longer seeing psychiatrist, giving that he was taking too long to be seen at each visit. Patient does continue to see therapist for her anxiety and depression. States she goes about once a month. She finds therapy to be very beneficial, and is helping her to get a handle on her symptoms.   Patient denies GI upset or headache while taking Lexapro. Denies suicidal thought or ideation Denies panic attack.   Past Medical History  Diagnosis Date  . Chicken pox as a child  . Anxiety   . Depression   . Diabetes mellitus     pre diabetes- when younger  . ADD (attention deficit disorder) 22 yrs old  . Asthma     as young child  . Sinusitis 12/17/2011  . Preventative health care 12/17/2011  . PCOS (polycystic ovarian syndrome) 12/17/2011  . Anxiety and depression   . IBS (irritable bowel syndrome) 12/18/2011  . 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 Weight gain 10/24/2012  . Stye 10/24/2012    Right eye  . Allergic state 10/24/2012    Current Outpatient Prescriptions on File Prior to Visit  Medication Sig Dispense Refill  . albuterol (PROVENTIL HFA;VENTOLIN HFA) 108 (90 BASE) MCG/ACT inhaler Inhale 2 puffs into the lungs every 6 (six) hours as needed for wheezing.  1 Inhaler  2  . cholecalciferol (VITAMIN D) 1000 UNITS tablet Take 2,000 Units by mouth daily.      Marland Kitchen dicyclomine (BENTYL) 20 MG tablet Take 20 mg by mouth as needed.      . drospirenone-ethinyl estradiol (YAZ,GIANVI,LORYNA) 3-0.02 MG tablet Take 1 tablet by mouth daily.      . fluticasone (FLONASE) 50 MCG/ACT nasal spray Place 2  sprays into both nostrils daily.  16 g  3  . loratadine (CLARITIN) 10 MG tablet Take 1 tablet (10 mg total) by mouth daily.  30 tablet  11  . Multiple Vitamin (MULTIVITAMIN) tablet Take 1 tablet by mouth daily.      . ondansetron (ZOFRAN) 4 MG tablet Take 1 tablet (4 mg total) by mouth every 8 (eight) hours as needed for nausea.  20 tablet  0   No current facility-administered medications on file prior to visit.    No Known Allergies  Family History  Problem Relation Age of Onset  . Diabetes Paternal Grandmother   . Hypertension Paternal Grandmother   . Hyperlipidemia Paternal Grandmother     History   Social History  . Marital Status: Unknown    Spouse Name: N/A    Number of Children: N/A  . Years of Education: N/A   Social History Main Topics  . Smoking status: Never Smoker   . Smokeless tobacco: Never Used  . Alcohol Use: No  . Drug Use: No  . Sexual Activity: Yes    Partners: Male   Other Topics Concern  . None   Social History Narrative  . None    Review of Systems - see history of present illness. All other review of systems are negative.    Filed Vitals:   05/30/13 1112  BP: 98/60  Pulse: 61  Temp: 98.7 F (37.1 C)   Physical Exam  Vitals reviewed. Constitutional: She is oriented to person, place, and time and well-developed, well-nourished, and in no distress.  HENT:  Head: Normocephalic and atraumatic.  Eyes: Conjunctivae are normal. Pupils are equal, round, and reactive to light.  Neck: Neck supple.  Cardiovascular: Normal rate, regular rhythm and normal heart sounds.   Pulmonary/Chest: Effort normal and breath sounds normal. No respiratory distress. She has no wheezes. She has no rales. She exhibits no tenderness.  Lymphadenopathy:    She has no cervical adenopathy.  Neurological: She is alert and oriented to person, place, and time.  Skin: Skin is warm and dry. No rash noted.  Psychiatric: Affect normal.   Assessment/Plan: Anxiety and  depression Refill Lexapro 10 mg tablet.  Further refills to come from her primary care provider, Dr. Abner Greenspan.

## 2013-05-30 NOTE — Progress Notes (Signed)
Pre visit review using our clinic review tool, if applicable. No additional management support is needed unless otherwise documented below in the visit note. 

## 2013-05-30 NOTE — Assessment & Plan Note (Signed)
Refill Lexapro 10 mg tablet.  Further refills to come from her primary care provider, Dr. Abner Greenspan.

## 2013-07-25 ENCOUNTER — Ambulatory Visit: Payer: Managed Care, Other (non HMO) | Admitting: Family

## 2013-09-26 ENCOUNTER — Other Ambulatory Visit: Payer: Self-pay | Admitting: Physician Assistant

## 2013-09-27 NOTE — Telephone Encounter (Signed)
Informed patient of medication refill and she states that she is moving to [place] in May and will establish with another provider there.

## 2013-09-27 NOTE — Telephone Encounter (Signed)
Lexapro refill sent to pharmacy. Pt was due for follow up with Dr Abner GreenspanBlyth around 08/28/13.  Please call pt to arrange follow up.

## 2014-03-15 ENCOUNTER — Other Ambulatory Visit: Payer: Self-pay | Admitting: Family Medicine

## 2014-03-16 NOTE — Telephone Encounter (Signed)
Please inform pt we sent in a 30 day supply but an appt will need to be scheduled with Dr Abner Greenspan for any additional refills

## 2014-03-16 NOTE — Telephone Encounter (Signed)
Informed patient of medication refill and to call our office to schedule appointment

## 2015-01-01 IMAGING — CR DG ABDOMEN 2V
2 series · 2 of 2 positions shown · non-contrast
Comparison: None.

CLINICAL DATA: Nausea, vomiting, possible small bowel obstruction

ABDOMEN - 2 VIEW

[w abdomen upright]
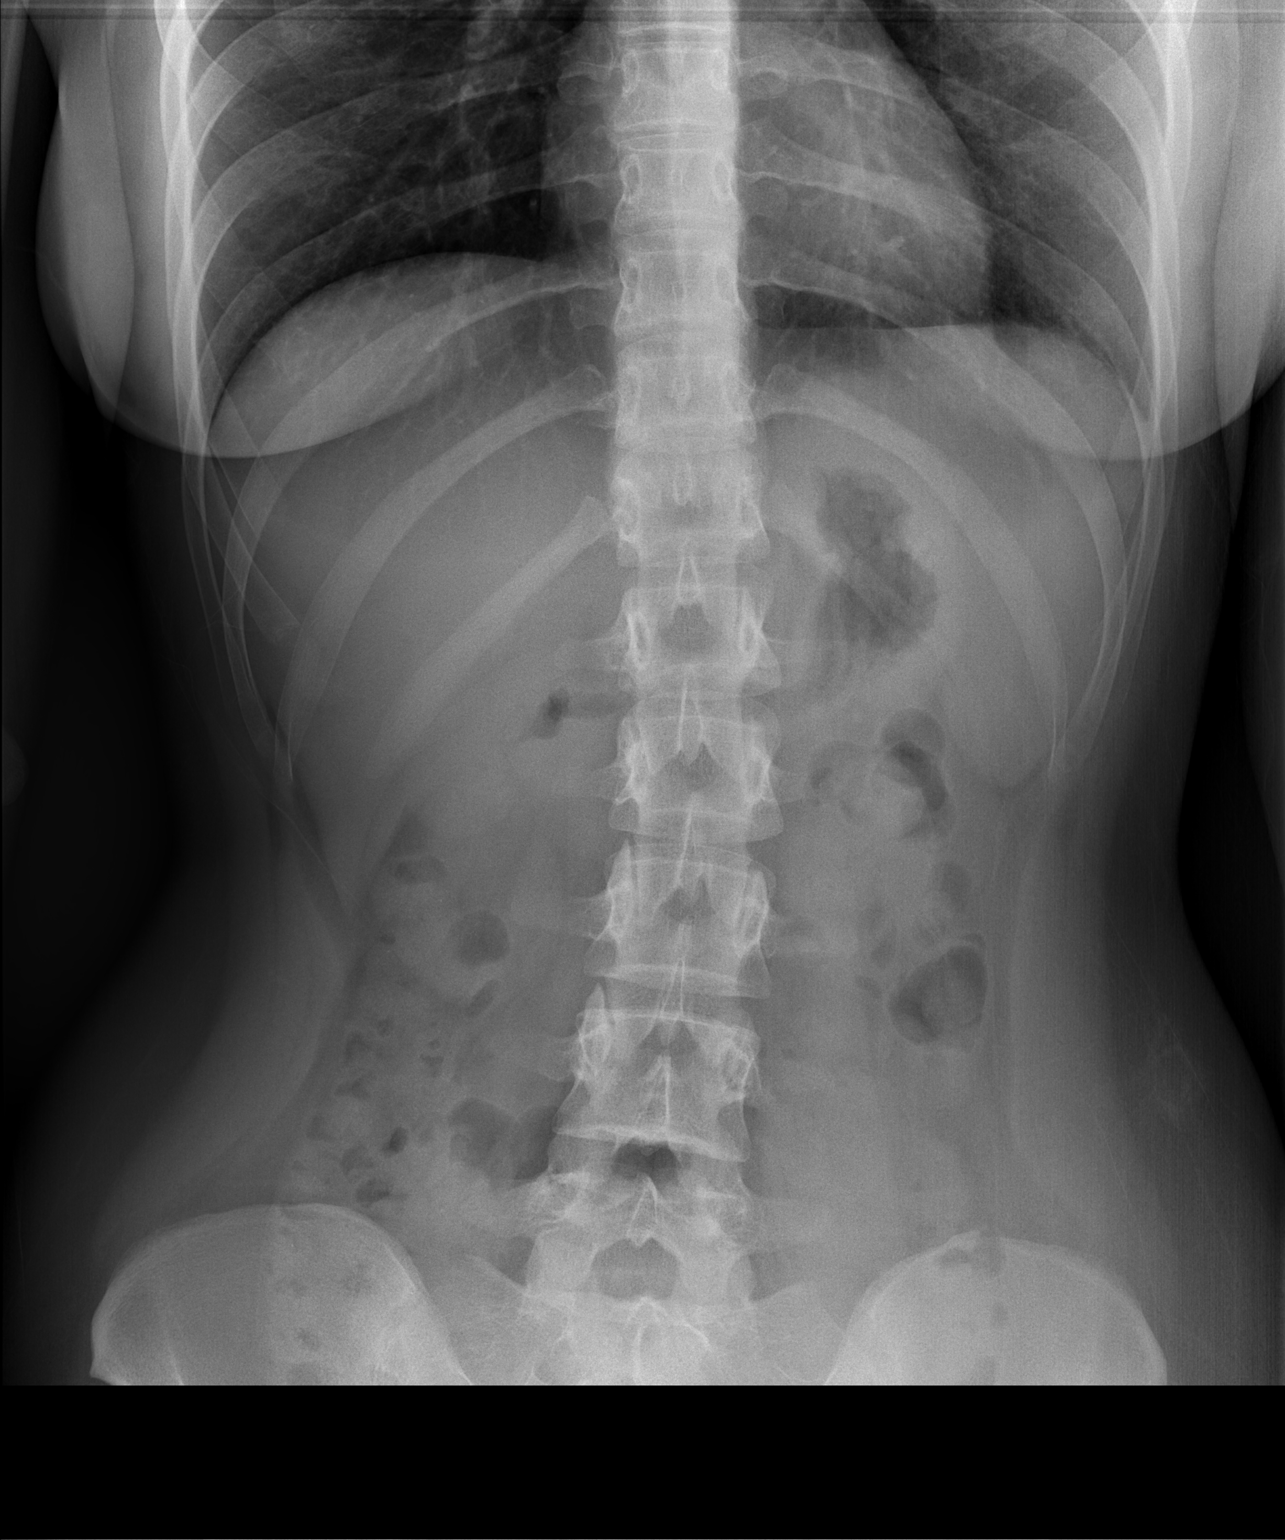

[t abdomen supine]
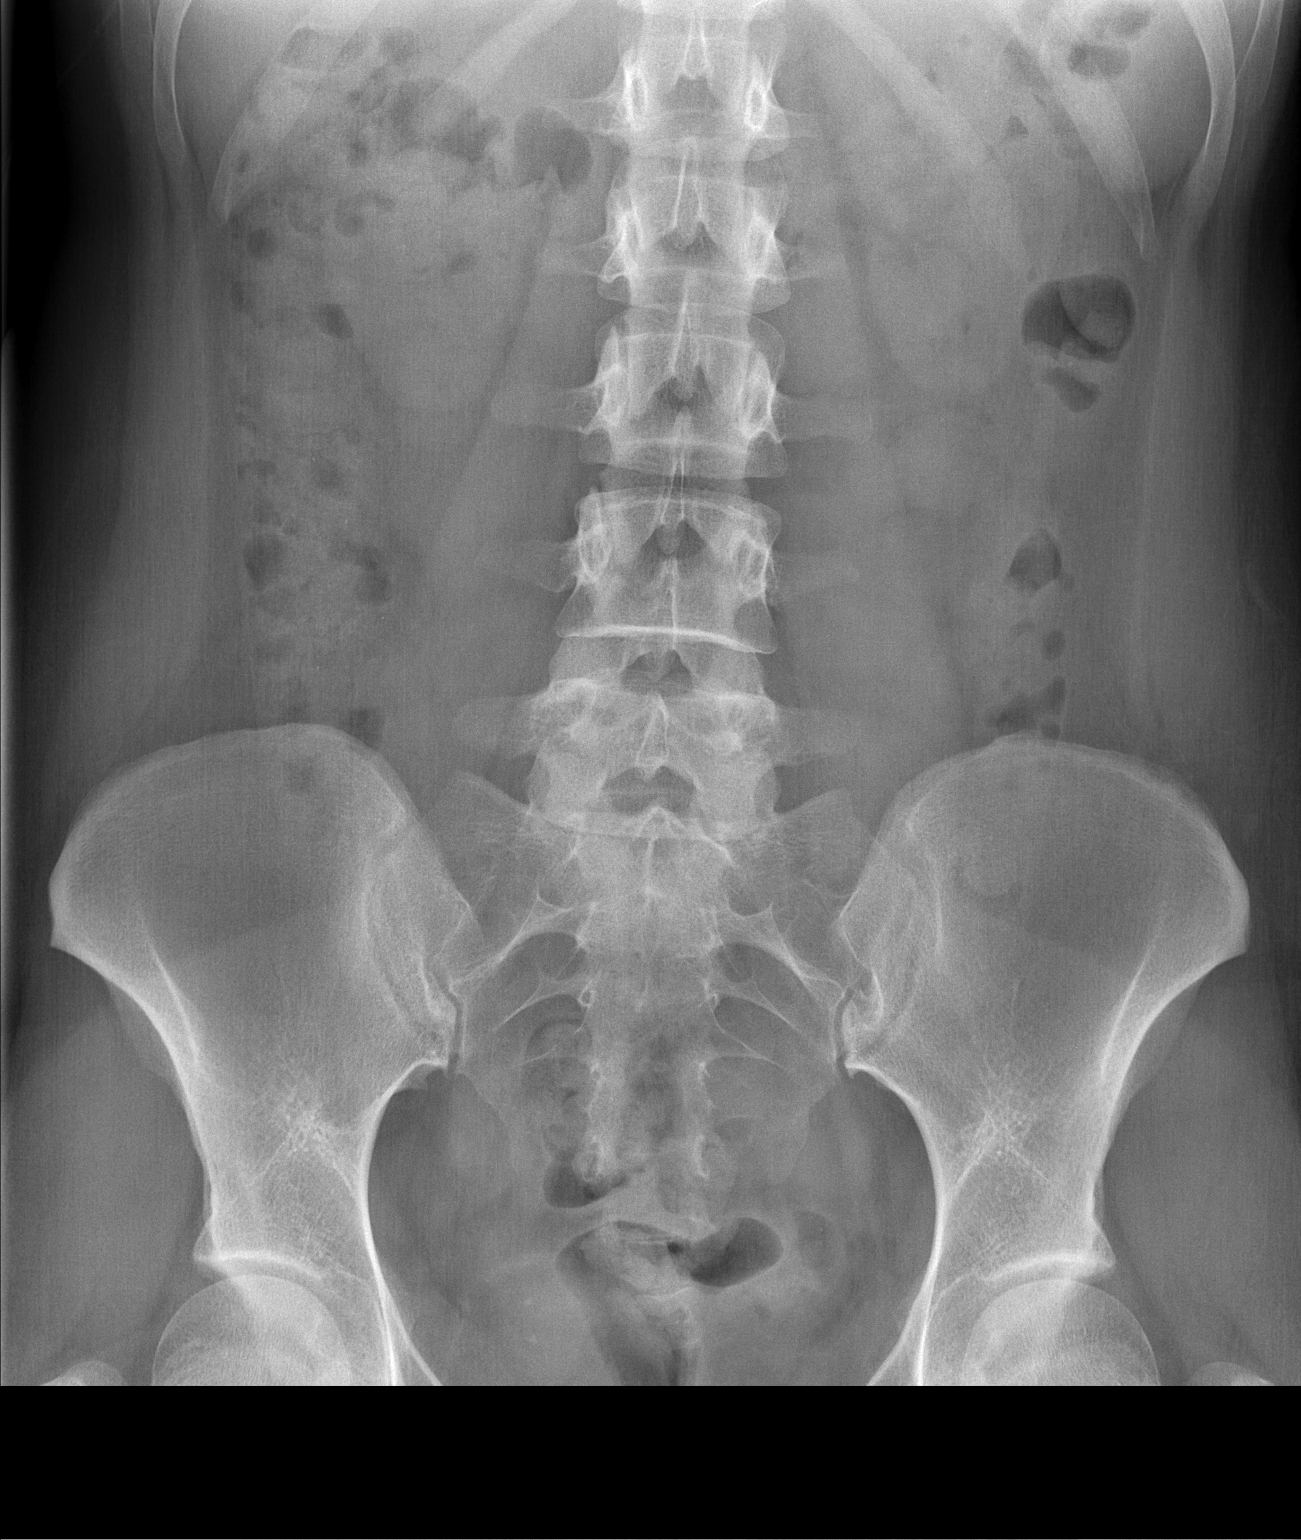

[2 of 2 positions shown; findings below may reference images not displayed]

FINDINGS: Mild levoscoliosis of the lumbar spine.  There is
nonspecific nonobstructive bowel gas pattern.  No free abdominal
air.  Some colonic stool.
IMPRESSION: Nonspecific nonobstructive bowel gas pattern.  No free abdominal
air.
